# Patient Record
Sex: Female | Born: 1965 | Marital: Married | Smoking: Never smoker
Health system: Southern US, Community
[De-identification: ages and names within clinical notes are randomized; demographics above are authoritative.]

## PROBLEM LIST (undated history)

## (undated) DIAGNOSIS — F32A Depression, unspecified: Secondary | ICD-10-CM

## (undated) DIAGNOSIS — M1990 Unspecified osteoarthritis, unspecified site: Secondary | ICD-10-CM

## (undated) DIAGNOSIS — F419 Anxiety disorder, unspecified: Secondary | ICD-10-CM

## (undated) HISTORY — PX: HEMORRHOID SURGERY: SHX153

---

## 2002-07-18 DIAGNOSIS — D509 Iron deficiency anemia, unspecified: Secondary | ICD-10-CM | POA: Insufficient documentation

## 2003-10-14 DIAGNOSIS — G43909 Migraine, unspecified, not intractable, without status migrainosus: Secondary | ICD-10-CM | POA: Insufficient documentation

## 2005-06-17 DIAGNOSIS — R609 Edema, unspecified: Secondary | ICD-10-CM | POA: Insufficient documentation

## 2006-05-29 DIAGNOSIS — R519 Headache, unspecified: Secondary | ICD-10-CM | POA: Insufficient documentation

## 2006-06-21 DIAGNOSIS — K649 Unspecified hemorrhoids: Secondary | ICD-10-CM | POA: Insufficient documentation

## 2009-04-07 DIAGNOSIS — K219 Gastro-esophageal reflux disease without esophagitis: Secondary | ICD-10-CM | POA: Insufficient documentation

## 2009-04-07 DIAGNOSIS — IMO0001 Reserved for inherently not codable concepts without codable children: Secondary | ICD-10-CM | POA: Insufficient documentation

## 2009-11-13 DIAGNOSIS — R200 Anesthesia of skin: Secondary | ICD-10-CM | POA: Insufficient documentation

## 2009-11-13 DIAGNOSIS — K625 Hemorrhage of anus and rectum: Secondary | ICD-10-CM | POA: Insufficient documentation

## 2009-11-13 DIAGNOSIS — M549 Dorsalgia, unspecified: Secondary | ICD-10-CM | POA: Insufficient documentation

## 2013-12-17 ENCOUNTER — Encounter: Payer: Self-pay | Admitting: Gastroenterology

## 2013-12-26 DIAGNOSIS — R197 Diarrhea, unspecified: Secondary | ICD-10-CM | POA: Insufficient documentation

## 2013-12-26 DIAGNOSIS — D649 Anemia, unspecified: Secondary | ICD-10-CM | POA: Insufficient documentation

## 2013-12-26 DIAGNOSIS — K921 Melena: Secondary | ICD-10-CM | POA: Insufficient documentation

## 2013-12-26 DIAGNOSIS — R109 Unspecified abdominal pain: Secondary | ICD-10-CM | POA: Insufficient documentation

## 2014-01-14 ENCOUNTER — Ambulatory Visit: Payer: Self-pay | Admitting: Gastroenterology

## 2014-01-14 ENCOUNTER — Encounter: Payer: Self-pay | Admitting: Gastroenterology

## 2014-01-14 ENCOUNTER — Telehealth: Payer: Self-pay | Admitting: Gastroenterology

## 2014-01-14 NOTE — Telephone Encounter (Signed)
Mailed letter °

## 2014-01-14 NOTE — Telephone Encounter (Signed)
Pt was a no show

## 2014-11-19 DIAGNOSIS — M25532 Pain in left wrist: Secondary | ICD-10-CM | POA: Diagnosis not present

## 2014-12-03 ENCOUNTER — Other Ambulatory Visit: Payer: Worker's Compensation | Admitting: Family

## 2014-12-03 DIAGNOSIS — M25532 Pain in left wrist: Secondary | ICD-10-CM | POA: Diagnosis not present

## 2014-12-03 DIAGNOSIS — S63512D Sprain of carpal joint of left wrist, subsequent encounter: Secondary | ICD-10-CM | POA: Diagnosis not present

## 2016-11-07 DIAGNOSIS — H6690 Otitis media, unspecified, unspecified ear: Secondary | ICD-10-CM | POA: Insufficient documentation

## 2016-11-07 DIAGNOSIS — M3500 Sicca syndrome, unspecified: Secondary | ICD-10-CM | POA: Insufficient documentation

## 2016-12-08 IMAGING — CR DG WRIST COMPLETE 3+V*L*
3 series · 3 of 3 positions shown · non-contrast
Comparison: None.

CLINICAL DATA: Fall.  Initial evaluation.

EXAM:
LEFT WRIST - COMPLETE 3+ VIEW

[view not recorded (1 of 3)]
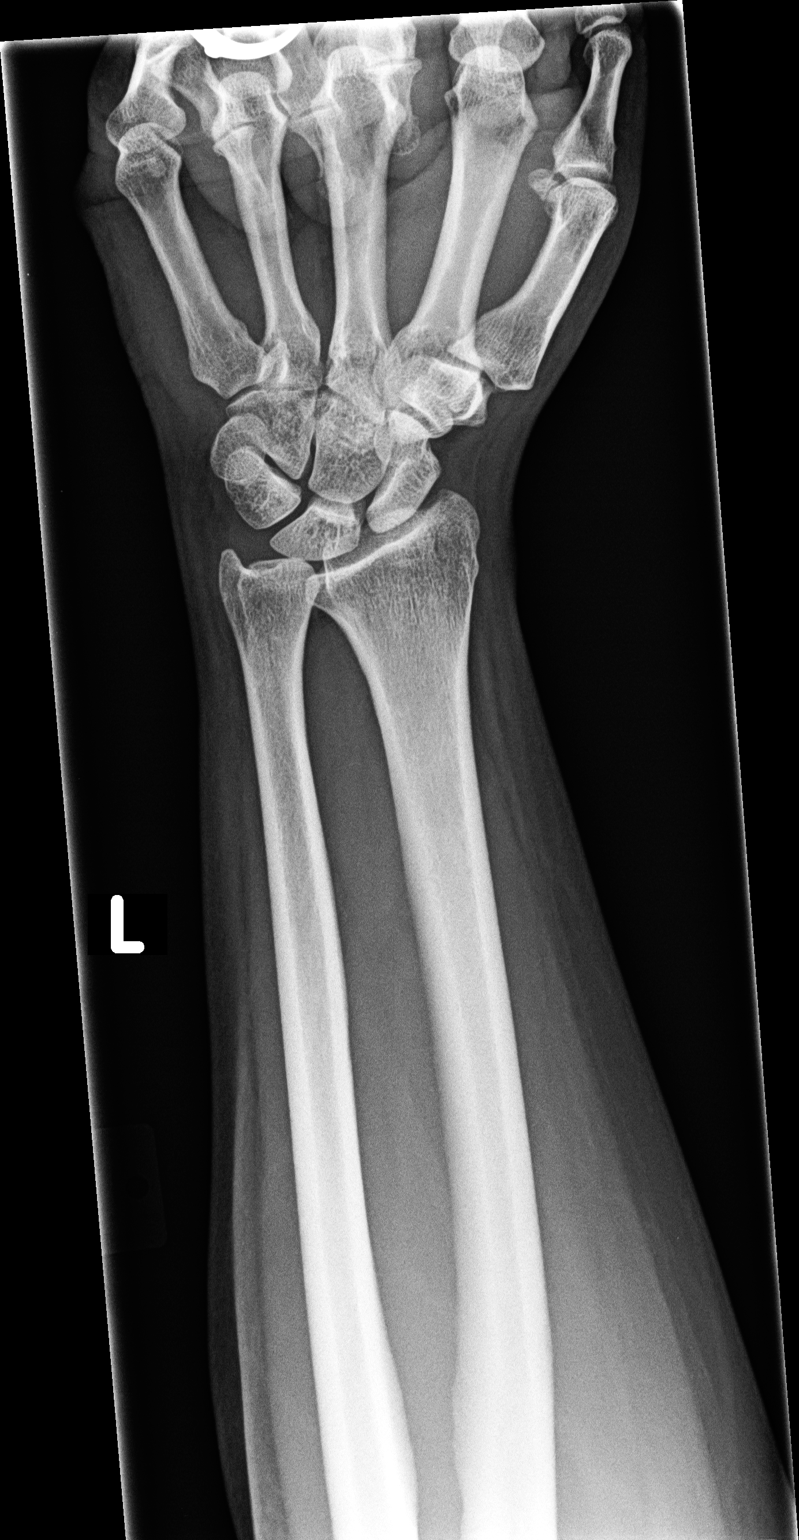

[view not recorded (2 of 3)]
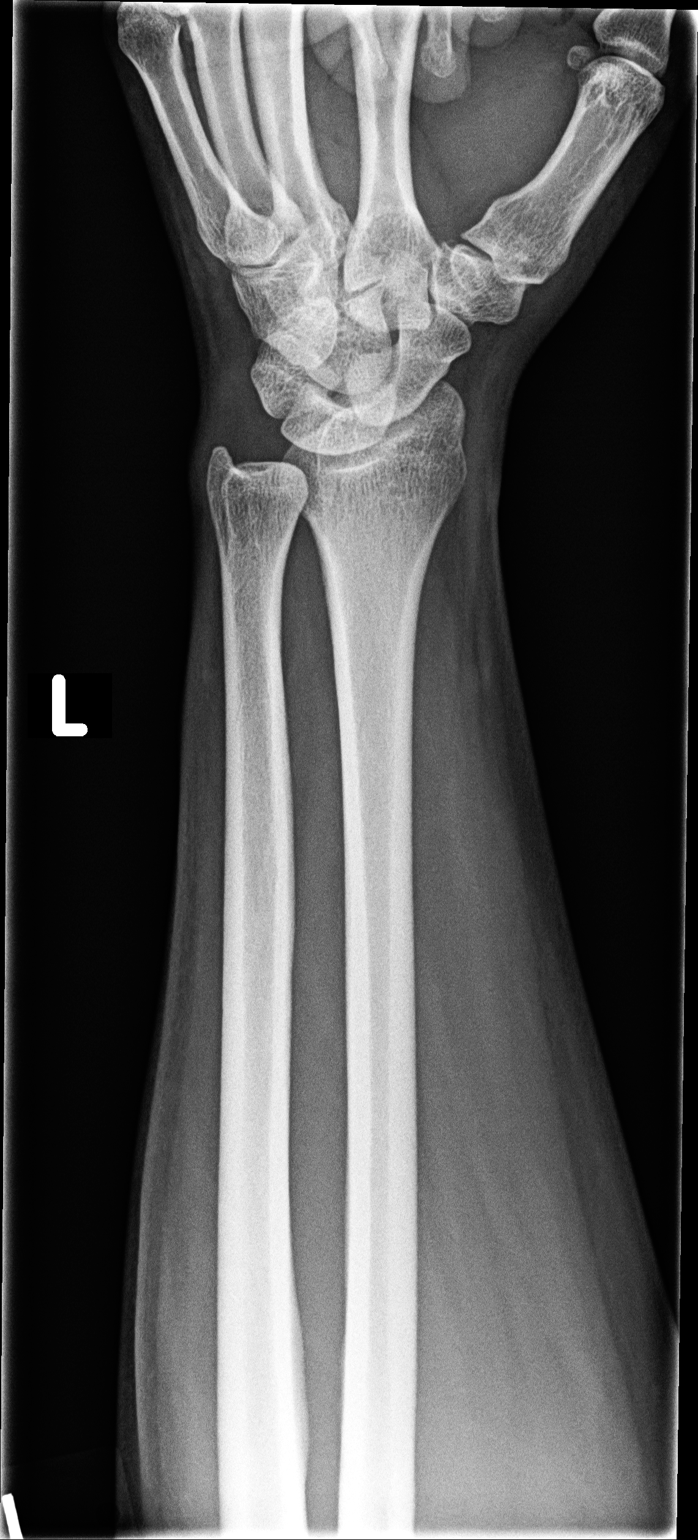

[view not recorded (3 of 3)]
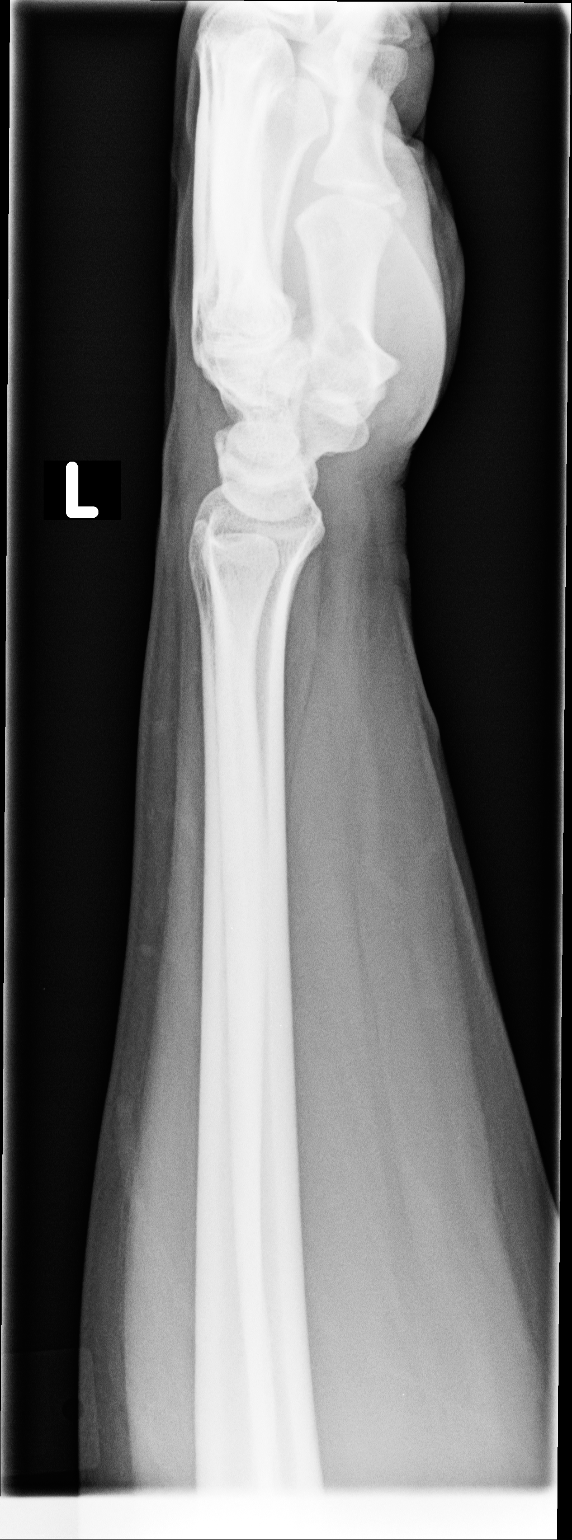

[3 of 3 positions shown; findings below may reference images not displayed]

FINDINGS: Very subtle nondisplaced fracture versus prominent vascular channel
noted in the radial styloid. No displaced fracture. Mild
degenerative changes. Degenerative changes noted about the wrist.
There is no evidence of fracture or dislocation.
IMPRESSION: Very subtle nondisplaced fracture versus prominent vascular channel
noted in the radial styloid. Follow-up imaging in 7 to 10 days can
be obtained if needed.

## 2016-12-22 IMAGING — CR DG WRIST COMPLETE 3+V*L*
3 series · 3 of 3 positions shown · non-contrast
Comparison: 11/05/2014.

CLINICAL DATA: Fracture follow-up.

EXAM:
LEFT WRIST - COMPLETE 3+ VIEW

[view not recorded (1 of 3)]
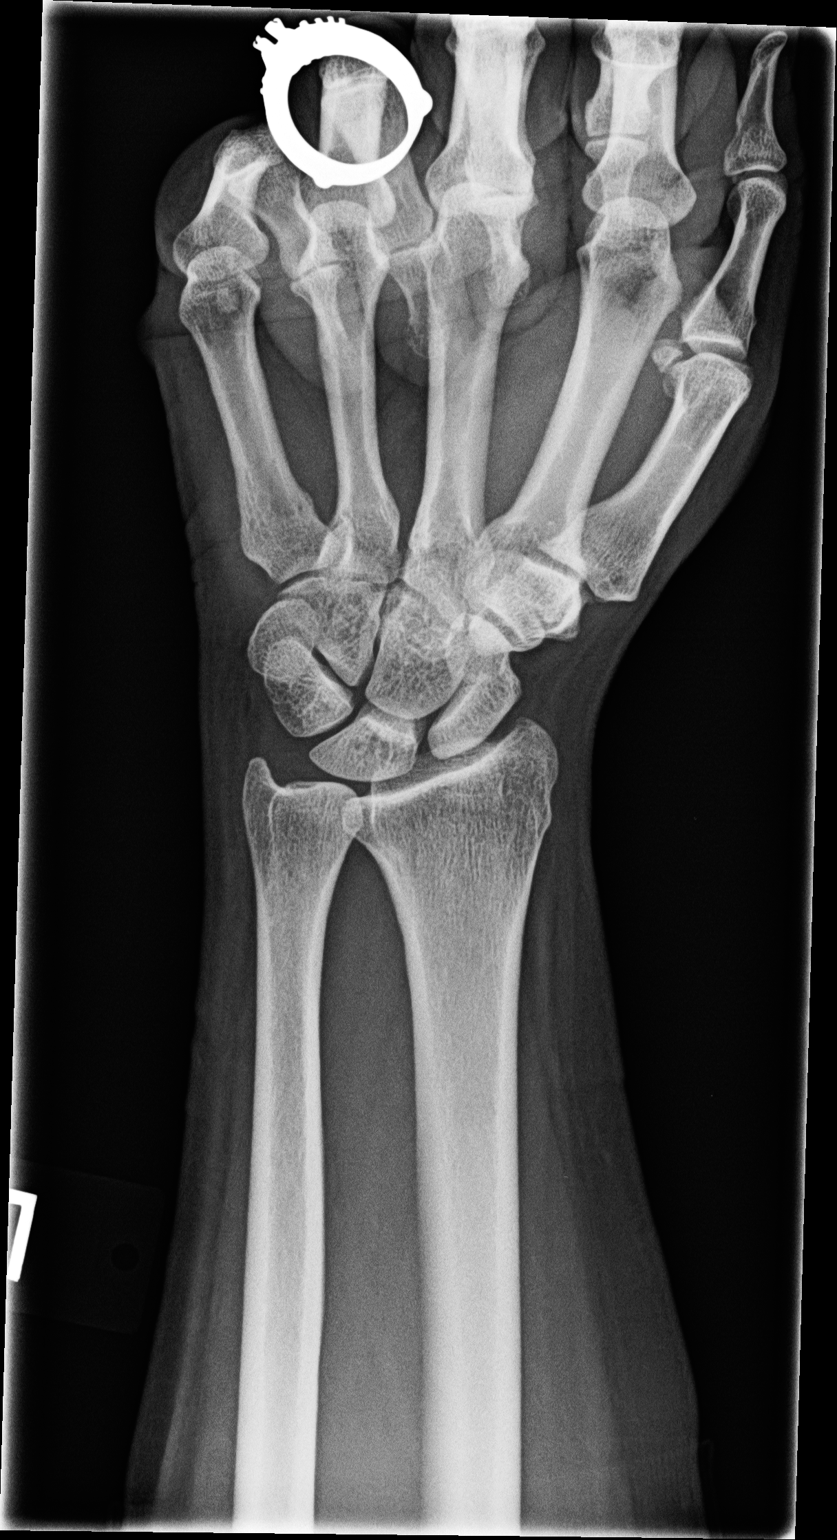

[view not recorded (2 of 3)]
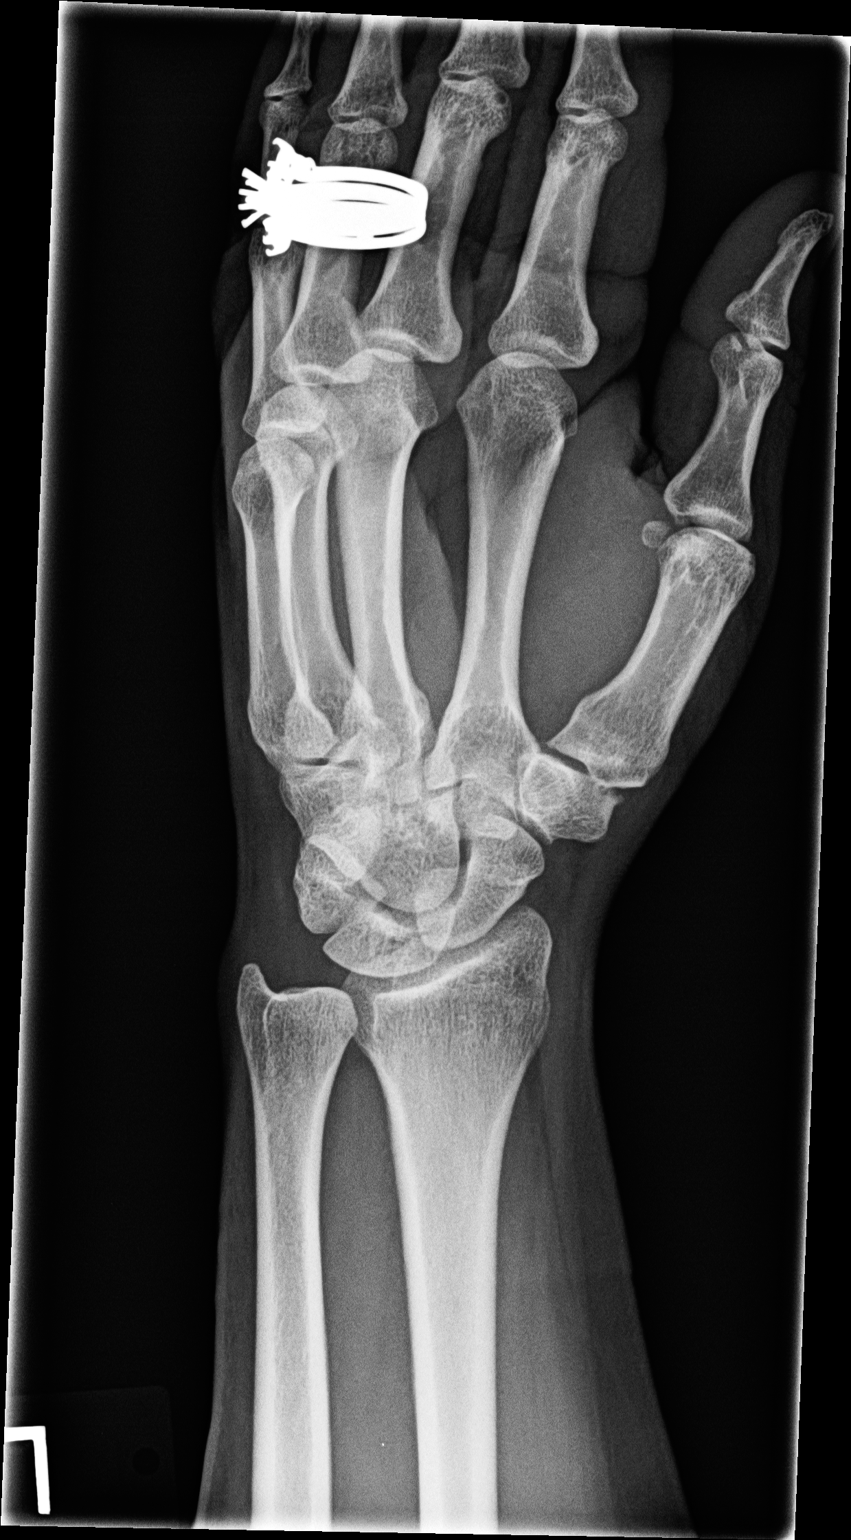

[view not recorded (3 of 3)]
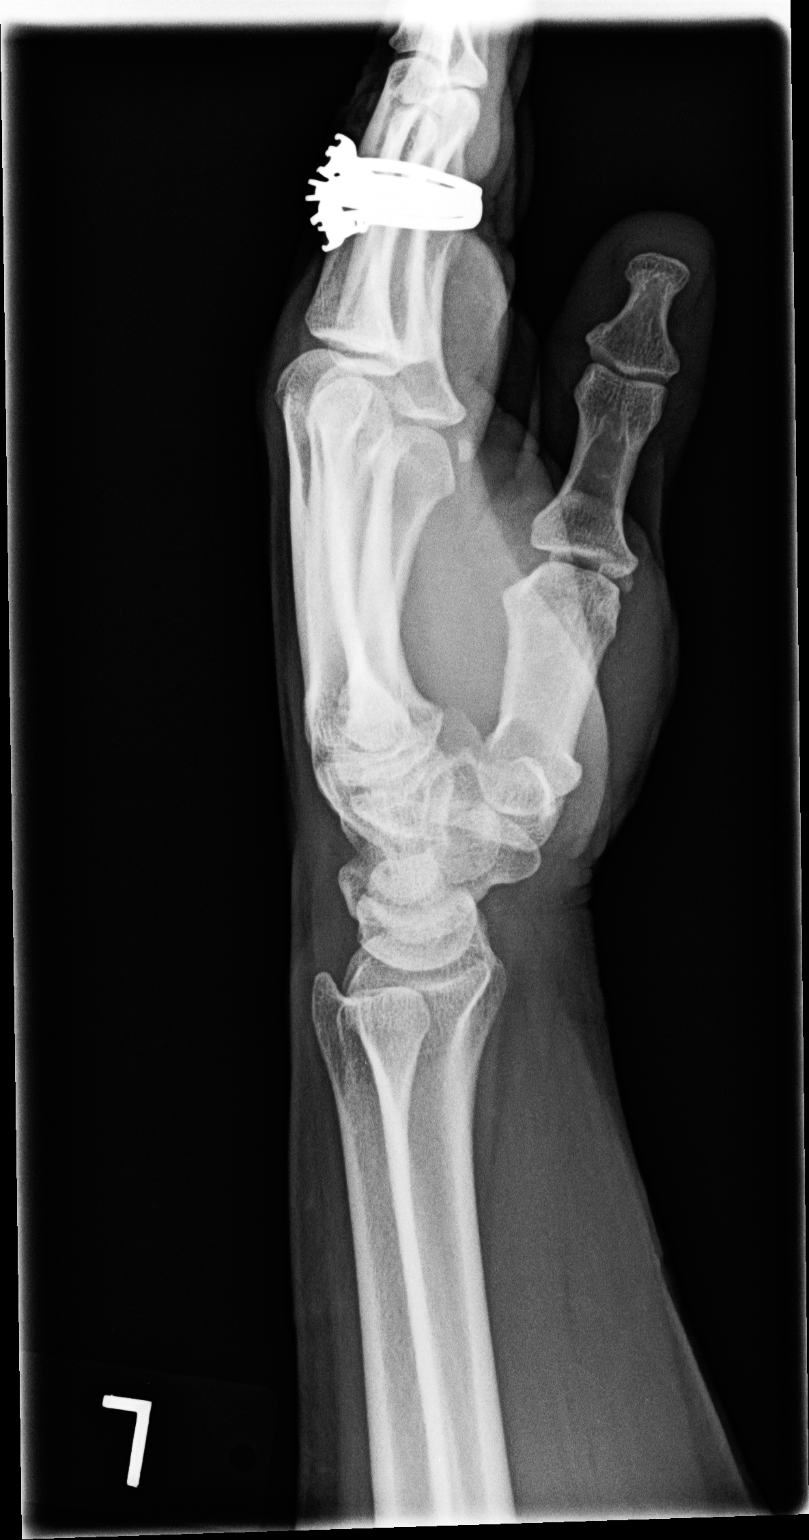

[3 of 3 positions shown; findings below may reference images not displayed]

FINDINGS: Subtle unchanged corticated lucency noted in the distal left radius.
This is most likely a vascular channel. No no other abnormality
identified. No definite evidence of fracture.
IMPRESSION: Previously identified lucency in the distal radius is most likely a
vascular channel. No definite acute fracture identified.

## 2018-06-25 DIAGNOSIS — I10 Essential (primary) hypertension: Secondary | ICD-10-CM | POA: Insufficient documentation

## 2019-01-31 DIAGNOSIS — N3281 Overactive bladder: Secondary | ICD-10-CM | POA: Insufficient documentation

## 2022-04-30 ENCOUNTER — Other Ambulatory Visit: Payer: Self-pay

## 2022-04-30 ENCOUNTER — Encounter (HOSPITAL_COMMUNITY): Payer: Self-pay | Admitting: *Deleted

## 2022-04-30 ENCOUNTER — Emergency Department (HOSPITAL_COMMUNITY): Payer: Commercial Managed Care - PPO

## 2022-04-30 ENCOUNTER — Emergency Department (HOSPITAL_COMMUNITY)
Admission: EM | Admit: 2022-04-30 | Discharge: 2022-04-30 | Disposition: A | Discharge status: Home / Self Care | Payer: Commercial Managed Care - PPO | Attending: Emergency Medicine | Admitting: Emergency Medicine

## 2022-04-30 DIAGNOSIS — M179 Osteoarthritis of knee, unspecified: Secondary | ICD-10-CM | POA: Diagnosis not present

## 2022-04-30 DIAGNOSIS — M25461 Effusion, right knee: Secondary | ICD-10-CM | POA: Diagnosis not present

## 2022-04-30 DIAGNOSIS — M1990 Unspecified osteoarthritis, unspecified site: Secondary | ICD-10-CM

## 2022-04-30 DIAGNOSIS — M25561 Pain in right knee: Secondary | ICD-10-CM | POA: Diagnosis present

## 2022-04-30 HISTORY — DX: Unspecified osteoarthritis, unspecified site: M19.90

## 2022-04-30 HISTORY — DX: Anxiety disorder, unspecified: F41.9

## 2022-04-30 HISTORY — DX: Depression, unspecified: F32.A

## 2022-04-30 MED ORDER — NAPROXEN 250 MG PO TABS
500.0000 mg | ORAL_TABLET | Freq: Once | ORAL | Status: AC
  Administered 2022-04-30: 500 mg via ORAL
  Filled 2022-04-30: qty 2

## 2022-04-30 MED ORDER — NAPROXEN 500 MG PO TABS: 500.0000 mg | ORAL_TABLET | Freq: Two times a day (BID) | ORAL | 0 refills | Status: AC

## 2022-04-30 MED ORDER — NAPROXEN 500 MG PO TABS: 500.0000 mg | ORAL_TABLET | Freq: Two times a day (BID) | ORAL | 0 refills | Status: DC

## 2022-04-30 NOTE — ED Provider Notes (Signed)
[facility] EMERGENCY DEPARTMENT Provider Note   CSN: [id number] Arrival date & time: 04/30/22  2104     History  Chief Complaint  Patient presents with   Knee Pain    Sherry Sims is a 56 y.o. female with medical history significant for osteoarthritis of knees.  The patient presents to ED for evaluation of right knee swelling and pain.  Patient states over the last 2 days her right knee is become progressively more swollen and painful.  The patient states that she has a history of osteoarthritis in this knee, states that last time she was seen by rheumatology was in 2021.  The patient states that she has tried taking Tylenol with slight relief of her symptoms always return.  The patient denies any fevers, redness or warmth to the joint, nausea or vomiting, body aches or chills.   Knee Pain Associated symptoms: no fever        Home Medications Prior to Admission medications   Medication Sig Start Date End Date Taking? Authorizing Provider  albuterol (VENTOLIN HFA) 108 (90 Base) MCG/ACT inhaler Inhale 2 puffs every 4 hours by inhalation route as needed. 01/14/22  Yes [provider]  azithromycin (ZITHROMAX) 250 MG tablet TAKE 2 TABLETS (500 MG) BY ORAL ROUTE ONCE DAILY FOR 1 DAY THEN 1 TABLET (250 MG) BY ORAL ROUTE ONCE DAILY FOR 4 DAYS 01/14/22  Yes [provider]  benzonatate (TESSALON) 100 MG capsule Take 1 capsule 3 times a day by oral route as needed for 7 days. 01/14/22  Yes [provider]  busPIRone (BUSPAR) 30 MG tablet Take 30 mg by mouth 2 (two) times daily.   Yes [provider]  diclofenac Sodium (VOLTAREN) 1 % GEL Apply topically. 04/10/20  Yes [provider]  FLUoxetine (PROZAC) 20 MG capsule Take 20 mg by mouth daily.   Yes [provider]  HYDROcodone-acetaminophen (NORCO/VICODIN) 5-325 MG tablet Take 1 tablet by mouth every 6 (six) hours as needed. 04/06/20  Yes [provider]  methylPREDNISolone acetate  (DEPO-MEDROL) 40 MG/ML injection Take 40 mg as needed by injection route. 01/14/22  Yes [provider]  naproxen (NAPROSYN) 500 MG tablet Take 1 tablet (500 mg total) by mouth 2 (two) times daily. 04/30/22  Yes Al Decant, PA-C  predniSONE (DELTASONE) 5 MG tablet 3 tabs a day for 4 days then 2 tabs a day for 4 day then 1 tab a day 04/20/20  Yes [provider]  albuterol (VENTOLIN HFA) 108 (90 Base) MCG/ACT inhaler SMARTSIG:2 Puff(s) By Mouth Every 4 Hours PRN 01/14/22   [provider]  azithromycin (ZITHROMAX) 250 MG tablet Take by mouth. 01/14/22   [provider]  benzonatate (TESSALON) 100 MG capsule Take 100 mg by mouth 3 (three) times daily as needed. 01/14/22   [provider]  busPIRone (BUSPAR) 10 MG tablet Take 10 mg by mouth 3 (three) times daily. 02/02/22   [provider]  busPIRone (BUSPAR) 15 MG tablet Take 15 mg by mouth 3 (three) times daily. 03/16/22   [provider]  diclofenac (VOLTAREN) 75 MG EC tablet Take by mouth.    [provider]  QUEtiapine (SEROQUEL) 100 MG tablet Take 100 mg by mouth at bedtime. 02/02/22   [provider]  SUMAtriptan (IMITREX) 100 MG tablet Take by mouth.    [provider]  topiramate (TOPAMAX) 50 MG tablet Take by mouth.    [provider]  traZODone (DESYREL) 100 MG tablet Take  50-100 mg by mouth at bedtime. 03/16/22   [provider]      Allergies    Patient has no known allergies.    Review of Systems   Review of Systems  Constitutional:  Negative for chills and fever.  Gastrointestinal:  Negative for nausea and vomiting.  Musculoskeletal:  Positive for arthralgias.  All other systems reviewed and are negative.   Physical Exam Updated Vital Signs BP (!) 144/83   Pulse 75   Temp 98.1 F (36.7 C) (Oral)   Resp 18   Ht 5\' 4"  (1.626 m)   Wt 86.2 kg   SpO2 97%   BMI 32.61 kg/m  Physical Exam Vitals and nursing note  reviewed.  Constitutional:      General: She is not in acute distress.    Appearance: Normal appearance. She is not ill-appearing, toxic-appearing or diaphoretic.  HENT:     Head: Normocephalic and atraumatic.     Nose: Nose normal. No congestion.     Mouth/Throat:     Mouth: Mucous membranes are moist.     Pharynx: Oropharynx is clear.  Eyes:     Extraocular Movements: Extraocular movements intact.     Conjunctiva/sclera: Conjunctivae normal.     Pupils: Pupils are equal, round, and reactive to light.  Cardiovascular:     Rate and Rhythm: Normal rate and regular rhythm.  Pulmonary:     Effort: Pulmonary effort is normal.     Breath sounds: Normal breath sounds. No wheezing.  Abdominal:     General: Abdomen is flat. Bowel sounds are normal.     Palpations: Abdomen is soft.     Tenderness: There is no abdominal tenderness.  Musculoskeletal:     Cervical back: Normal range of motion and neck supple. No tenderness.     Right knee: Swelling and effusion present. No deformity, erythema, ecchymosis or lacerations. Decreased range of motion. Tenderness present.     Left knee: Normal.     Comments: Patient with decreased flexion secondary to pain, knee is swollen.  The patient knee is not erythematous, it is not hot to touch.  Skin:    General: Skin is warm and dry.     Capillary Refill: Capillary refill takes less than 2 seconds.  Neurological:     Mental Status: She is alert and oriented to person, place, and time.     ED Results / Procedures / Treatments   Labs (all labs ordered are listed, but only abnormal results are displayed) Labs Reviewed - No data to display  EKG None  Radiology DG Knee Complete 4 Views Right  Result Date: 04/30/2022 CLINICAL DATA:  Knee pain and swelling, no known injury, initial encounter EXAM: RIGHT KNEE - COMPLETE 4+ VIEW COMPARISON:  None Available. FINDINGS: Mild degenerative changes are noted in all 3 joint compartments. Small joint effusion is  seen. No acute fracture or dislocation is noted. IMPRESSION: Joint effusion and degenerative change without acute bony abnormality. Electronically Signed   By: 06/30/2022 M.D.   On: 04/30/2022 22:30    Procedures Procedures   Medications Ordered in ED Medications  naproxen (NAPROSYN) tablet 500 mg (500 mg Oral Given 04/30/22 2223)    ED Course/ Medical Decision Making/ A&P                           Medical Decision Making Amount and/or Complexity of Data Reviewed Radiology: ordered.  Risk Prescription drug management.  56 year old female presents to the ED for evaluation.  Please see HPI for further details.  On examination, the patient is afebrile and nontachycardic.  Patient lung sounds clear bilaterally, she is not hypoxic on room air.  Patient abdomen soft and compressible.  Patient right knee is swollen with what appears to be an effusion.  The patient right knee has decreased flexion secondary to pain.  The knee joint is not hot to touch, there is no overlying erythema to suggest septic arthritis.  The patient is afebrile.  Patient plain film imaging of right knee does show effusion, arthritis.  The patient will be sent home with naproxen.  The patient will be advised to follow-up with her orthopedic doctor as well as her rheumatology clinic.  The patient has been given return precautions to include fevers, localized redness and swelling to her knee joint.  The patient has voiced understanding of these instructions.  The patient has had all of her questions answered to her satisfaction prior to discharge.  The patient is stable at this time for discharge home.  Final Clinical Impression(s) / ED Diagnoses Final diagnoses:  Effusion of right knee  Arthritis    Rx / DC Orders ED Discharge Orders          Ordered    naproxen (NAPROSYN) 500 MG tablet  2 times daily        04/30/22 2254              Al Decant, PA-C 04/30/22 2256    Linwood Dibbles,  MD 05/02/22 [phone]

## 2022-04-30 NOTE — Discharge Instructions (Addendum)
Please return to the ED with any new symptoms such as fevers, localized redness to the knee Please follow-up with your orthopedic doctor or your rheumatology clinic Please utilize RICE.  Rest, ice, compress, elevate.  This will help allow your knee swelling to decrease. Please pick up naproxen and begin taking this medication. Please read attached guide concerning arthritis

## 2022-04-30 NOTE — ED Triage Notes (Addendum)
Pt with swelling and pain to right knee since Friday, pt with known arthritis to same knee. Denies any fall or injury to knee.

## 2024-10-24 ENCOUNTER — Other Ambulatory Visit (HOSPITAL_COMMUNITY): Payer: Self-pay

## 2024-10-24 DIAGNOSIS — Z1231 Encounter for screening mammogram for malignant neoplasm of breast: Secondary | ICD-10-CM

## 2024-11-01 ENCOUNTER — Ambulatory Visit (HOSPITAL_COMMUNITY)
